# Patient Record
Sex: Female | Born: 1955 | Race: White | Hispanic: No | Marital: Single | Smoking: Never smoker
Health system: Southern US, Community
[De-identification: ages and names within clinical notes are randomized; demographics above are authoritative.]

---

## 1991-02-12 HISTORY — PX: ORIF ANKLE FRACTURE: SHX5408

## 1994-02-11 HISTORY — PX: FINGER SURGERY: SHX640

## 1999-04-02 ENCOUNTER — Ambulatory Visit (HOSPITAL_COMMUNITY): Admission: RE | Admit: 1999-04-02 | Discharge: 1999-04-02 | Payer: Self-pay | Admitting: *Deleted

## 2000-04-07 ENCOUNTER — Ambulatory Visit (HOSPITAL_COMMUNITY): Admission: RE | Admit: 2000-04-07 | Discharge: 2000-04-07 | Payer: Self-pay | Admitting: Family Medicine

## 2000-04-07 ENCOUNTER — Encounter: Payer: Self-pay | Admitting: Family Medicine

## 2000-04-14 ENCOUNTER — Encounter: Payer: Self-pay | Admitting: Family Medicine

## 2000-04-14 ENCOUNTER — Encounter: Admission: RE | Admit: 2000-04-14 | Discharge: 2000-04-14 | Payer: Self-pay | Admitting: Family Medicine

## 2004-02-22 ENCOUNTER — Ambulatory Visit: Payer: Self-pay | Admitting: Internal Medicine

## 2004-03-06 ENCOUNTER — Ambulatory Visit: Payer: Self-pay | Admitting: Internal Medicine

## 2004-03-06 HISTORY — PX: COLONOSCOPY: SHX174

## 2014-03-03 ENCOUNTER — Encounter: Payer: Self-pay | Admitting: Internal Medicine

## 2017-08-11 HISTORY — PX: OTHER SURGICAL HISTORY: SHX169

## 2017-08-12 DIAGNOSIS — Z01818 Encounter for other preprocedural examination: Secondary | ICD-10-CM

## 2017-11-06 ENCOUNTER — Other Ambulatory Visit: Payer: Self-pay | Admitting: Family Medicine

## 2017-11-06 DIAGNOSIS — R928 Other abnormal and inconclusive findings on diagnostic imaging of breast: Secondary | ICD-10-CM

## 2017-11-13 ENCOUNTER — Ambulatory Visit
Admission: RE | Admit: 2017-11-13 | Discharge: 2017-11-13 | Disposition: A | Discharge status: Home / Self Care | Payer: BLUE CROSS/BLUE SHIELD | Source: Ambulatory Visit | Attending: Family Medicine | Admitting: Family Medicine

## 2017-11-13 ENCOUNTER — Other Ambulatory Visit: Payer: Self-pay | Admitting: Family Medicine

## 2017-11-13 DIAGNOSIS — R928 Other abnormal and inconclusive findings on diagnostic imaging of breast: Secondary | ICD-10-CM

## 2017-11-13 DIAGNOSIS — R921 Mammographic calcification found on diagnostic imaging of breast: Secondary | ICD-10-CM

## 2017-11-14 ENCOUNTER — Other Ambulatory Visit: Payer: Self-pay | Admitting: Diagnostic Radiology

## 2017-11-14 DIAGNOSIS — R921 Mammographic calcification found on diagnostic imaging of breast: Secondary | ICD-10-CM

## 2017-12-19 ENCOUNTER — Other Ambulatory Visit: Payer: Self-pay | Admitting: General Surgery

## 2017-12-19 DIAGNOSIS — R928 Other abnormal and inconclusive findings on diagnostic imaging of breast: Secondary | ICD-10-CM

## 2018-03-23 DIAGNOSIS — R7303 Prediabetes: Secondary | ICD-10-CM | POA: Diagnosis not present

## 2018-03-23 DIAGNOSIS — E785 Hyperlipidemia, unspecified: Secondary | ICD-10-CM | POA: Diagnosis not present

## 2018-03-23 DIAGNOSIS — Z6835 Body mass index (BMI) 35.0-35.9, adult: Secondary | ICD-10-CM | POA: Diagnosis not present

## 2018-04-10 ENCOUNTER — Encounter: Payer: Self-pay | Admitting: Gastroenterology

## 2018-04-13 ENCOUNTER — Ambulatory Visit (AMBULATORY_SURGERY_CENTER): Payer: Self-pay | Admitting: *Deleted

## 2018-04-13 ENCOUNTER — Encounter: Payer: Self-pay | Admitting: Gastroenterology

## 2018-04-13 VITALS — Ht 66.0 in | Wt 215.0 lb

## 2018-04-13 DIAGNOSIS — Z1211 Encounter for screening for malignant neoplasm of colon: Secondary | ICD-10-CM

## 2018-04-13 MED ORDER — PEG-KCL-NACL-NASULF-NA ASC-C 140 G PO SOLR: 1.0000 | Freq: Once | ORAL | 0 refills | Status: AC

## 2018-04-13 NOTE — Progress Notes (Signed)
Patient denies any allergies to eggs or soy. Patient denies any problems with anesthesia/sedation. Patient denies any oxygen use at home. Patient denies taking any diet/weight loss medications or blood thinners. EMMI education offered, pt declined. Plenvu sample given today.

## 2018-04-14 ENCOUNTER — Encounter: Payer: Self-pay | Admitting: Gastroenterology

## 2018-04-20 ENCOUNTER — Other Ambulatory Visit: Payer: Self-pay

## 2018-04-20 ENCOUNTER — Ambulatory Visit (AMBULATORY_SURGERY_CENTER): Payer: PPO | Admitting: Gastroenterology

## 2018-04-20 ENCOUNTER — Encounter: Payer: Self-pay | Admitting: Gastroenterology

## 2018-04-20 VITALS — BP 137/76 | HR 66 | Temp 97.3°F | Resp 13 | Ht 66.0 in | Wt 215.0 lb

## 2018-04-20 DIAGNOSIS — D128 Benign neoplasm of rectum: Secondary | ICD-10-CM

## 2018-04-20 DIAGNOSIS — D123 Benign neoplasm of transverse colon: Secondary | ICD-10-CM

## 2018-04-20 DIAGNOSIS — K621 Rectal polyp: Secondary | ICD-10-CM | POA: Diagnosis not present

## 2018-04-20 DIAGNOSIS — Z1211 Encounter for screening for malignant neoplasm of colon: Secondary | ICD-10-CM

## 2018-04-20 DIAGNOSIS — D129 Benign neoplasm of anus and anal canal: Secondary | ICD-10-CM

## 2018-04-20 MED ORDER — SODIUM CHLORIDE 0.9 % IV SOLN: 500.0000 mL | INTRAVENOUS | Status: DC

## 2018-04-20 NOTE — Progress Notes (Signed)
Pt's states no medical or surgical changes since previsit or office visit. 

## 2018-04-20 NOTE — Patient Instructions (Signed)
Handouts Provided:  Polyps and Diverticulosis  YOU HAD AN ENDOSCOPIC PROCEDURE TODAY AT [facility]:   Refer to the procedure report that was given to you for any specific questions about what was found during the examination.  If the procedure report does not answer your questions, please call your gastroenterologist to clarify.  If you requested that your care partner not be given the details of your procedure findings, then the procedure report has been included in a sealed envelope for you to review at your convenience later.  YOU SHOULD EXPECT: Some feelings of bloating in the abdomen. Passage of more gas than usual.  Walking can help get rid of the air that was put into your GI tract during the procedure and reduce the bloating. If you had a lower endoscopy (such as a colonoscopy or flexible sigmoidoscopy) you may notice spotting of blood in your stool or on the toilet paper. If you underwent a bowel prep for your procedure, you may not have a normal bowel movement for a few days.  Please Note:  You might notice some irritation and congestion in your nose or some drainage.  This is from the oxygen used during your procedure.  There is no need for concern and it should clear up in a day or so.  SYMPTOMS TO REPORT IMMEDIATELY:   Following lower endoscopy (colonoscopy or flexible sigmoidoscopy):  Excessive amounts of blood in the stool  Significant tenderness or worsening of abdominal pains  Swelling of the abdomen that is new, acute  Fever of 100F or higher  For urgent or emergent issues, a gastroenterologist can be reached at any hour by calling [phone].   DIET:  We do recommend a small meal at first, but then you may proceed to your regular diet.  Drink plenty of fluids but you should avoid alcoholic beverages for 24 hours.  ACTIVITY:  You should plan to take it easy for the rest of today and you should NOT DRIVE or use heavy machinery until tomorrow (because of  the sedation medicines used during the test).    FOLLOW UP: Our staff will call the number listed on your records the next business day following your procedure to check on you and address any questions or concerns that you may have regarding the information given to you following your procedure. If we do not reach you, we will leave a message.  However, if you are feeling well and you are not experiencing any problems, there is no need to return our call.  We will assume that you have returned to your regular daily activities without incident.  If any biopsies were taken you will be contacted by phone or by letter within the next 1-3 weeks.  Please call us at [phone] if you have not heard about the biopsies in 3 weeks.    SIGNATURES/CONFIDENTIALITY: You and/or your care partner have signed paperwork which will be entered into your electronic medical record.  These signatures attest to the fact that that the information above on your After Visit Summary has been reviewed and is understood.  Full responsibility of the confidentiality of this discharge information lies with you and/or your care-partner.

## 2018-04-20 NOTE — Progress Notes (Signed)
Called to room to assist during endoscopic procedure.  Patient ID and intended procedure confirmed with present staff. Received instructions for my participation in the procedure from the performing physician.  

## 2018-04-20 NOTE — Progress Notes (Signed)
A and O x3. Report to RN. Tolerated MAC anesthesia well.

## 2018-04-20 NOTE — Op Note (Signed)
[facility] Patient Name: Kayla Stevens Procedure Date: 04/20/2018 9:19 AM MRN: [id number] Endoscopist: Mauri Pole , MD Age: 63 Referring MD:  Date of Birth: 1955/10/13 Gender: Female Account #: [id number] Procedure:                Colonoscopy Indications:              Screening for colorectal malignant neoplasm Medicines:                Monitored Anesthesia Care Procedure:                Pre-Anesthesia Assessment:                           - Prior to the procedure, a History and Physical                            was performed, and patient medications and                            allergies were reviewed. The patient's tolerance of                            previous anesthesia was also reviewed. The risks                            and benefits of the procedure and the sedation                            options and risks were discussed with the patient.                            All questions were answered, and informed consent                            was obtained. Prior Anticoagulants: The patient has                            taken no previous anticoagulant or antiplatelet                            agents. ASA Grade Assessment: II - A patient with                            mild systemic disease. After reviewing the risks                            and benefits, the patient was deemed in                            satisfactory condition to undergo the procedure.                           After obtaining informed consent, the colonoscope  was passed under direct vision. Throughout the                            procedure, the patient's blood pressure, pulse, and                            oxygen saturations were monitored continuously. The                            Model PCF-[id number]) scope was introduced                            through the anus and advanced to the the cecum,                            identified  by appendiceal orifice and ileocecal                            valve. The colonoscopy was performed without                            difficulty. The patient tolerated the procedure                            well. The quality of the bowel preparation was                            good. The ileocecal valve, appendiceal orifice, and                            rectum were photographed. Scope In: 9:27:51 AM Scope Out: 9:47:04 AM Scope Withdrawal Time: 0 hours 10 minutes 41 seconds  Total Procedure Duration: 0 hours 19 minutes 13 seconds  Findings:                 The perianal and digital rectal examinations were                            normal.                           Two sessile polyps were found in the rectum and                            transverse colon. The polyps were 1 to 2 mm in                            size. These polyps were removed with a cold biopsy                            forceps. Resection and retrieval were complete.                           Multiple small and large-mouthed diverticula were  found in the sigmoid colon, descending colon,                            transverse colon, ascending colon and cecum. There                            was narrowing of the colon in association with the                            diverticular opening. Peri-diverticular erythema                            was seen. There was evidence of an impacted                            diverticulum.                           Non-bleeding internal hemorrhoids were found during                            retroflexion. The hemorrhoids were small. Complications:            No immediate complications. Estimated Blood Loss:     Estimated blood loss was minimal. Impression:               - Two 1 to 2 mm polyps in the rectum and in the                            transverse colon, removed with a cold biopsy                            forceps. Resected and retrieved.                            - Severe diverticulosis in the sigmoid colon, in                            the descending colon, in the transverse colon, in                            the ascending colon and in the cecum. There was                            narrowing of the colon in association with the                            diverticular opening. Peri-diverticular erythema                            was seen. There was evidence of an impacted                            diverticulum.                           -  Non-bleeding internal hemorrhoids. Recommendation:           - Patient has a contact number available for                            emergencies. The signs and symptoms of potential                            delayed complications were discussed with the                            patient. Return to normal activities tomorrow.                            Written discharge instructions were provided to the                            patient.                           - Resume previous diet.                           - Continue present medications.                           - Await pathology results.                           - Repeat colonoscopy in 7-10 years for surveillance                            based on pathology results. Mauri Pole, MD 04/20/2018 9:51:05 AM This report has been signed electronically.

## 2018-04-20 NOTE — Progress Notes (Signed)
Patient complained of gas pains while sitting in wheelchair, took her back to the restroom and gave her two levsin.  Patient found relief as she continued to pass gas.  Patient stated she's ready for discharge.

## 2018-04-21 ENCOUNTER — Telehealth: Payer: Self-pay | Admitting: *Deleted

## 2018-04-21 NOTE — Telephone Encounter (Signed)
First attempt, left VM.  

## 2018-04-21 NOTE — Telephone Encounter (Signed)
  Follow up Call-  Call back number 04/20/2018  Post procedure Call Back phone  # [phone] cell  Permission to leave phone message Yes  Some recent data might be hidden     Patient questions:  Do you have a fever, pain , or abdominal swelling? No. Pain Score  0 *  Have you tolerated food without any problems? Yes.    Have you been able to return to your normal activities? Yes.    Do you have any questions about your discharge instructions: Diet   No. Medications  No. Follow up visit  No.  Do you have questions or concerns about your Care? No.  Actions: * If pain score is 4 or above: No action needed, pain <4.

## 2018-04-27 ENCOUNTER — Encounter: Payer: Self-pay | Admitting: Gastroenterology

## 2018-06-24 DIAGNOSIS — Z0000 Encounter for general adult medical examination without abnormal findings: Secondary | ICD-10-CM | POA: Diagnosis not present

## 2018-06-24 DIAGNOSIS — Z1331 Encounter for screening for depression: Secondary | ICD-10-CM | POA: Diagnosis not present

## 2018-06-24 DIAGNOSIS — Z139 Encounter for screening, unspecified: Secondary | ICD-10-CM | POA: Diagnosis not present

## 2018-07-13 DIAGNOSIS — E785 Hyperlipidemia, unspecified: Secondary | ICD-10-CM | POA: Diagnosis not present

## 2018-07-13 DIAGNOSIS — R7303 Prediabetes: Secondary | ICD-10-CM | POA: Diagnosis not present

## 2018-07-20 DIAGNOSIS — R7303 Prediabetes: Secondary | ICD-10-CM | POA: Diagnosis not present

## 2018-07-20 DIAGNOSIS — E785 Hyperlipidemia, unspecified: Secondary | ICD-10-CM | POA: Diagnosis not present

## 2018-07-20 DIAGNOSIS — Z6834 Body mass index (BMI) 34.0-34.9, adult: Secondary | ICD-10-CM | POA: Diagnosis not present

## 2018-11-12 DIAGNOSIS — R7303 Prediabetes: Secondary | ICD-10-CM | POA: Diagnosis not present

## 2018-11-12 DIAGNOSIS — Z23 Encounter for immunization: Secondary | ICD-10-CM | POA: Diagnosis not present

## 2018-11-12 DIAGNOSIS — E785 Hyperlipidemia, unspecified: Secondary | ICD-10-CM | POA: Diagnosis not present

## 2018-11-19 DIAGNOSIS — R928 Other abnormal and inconclusive findings on diagnostic imaging of breast: Secondary | ICD-10-CM | POA: Diagnosis not present

## 2018-11-19 DIAGNOSIS — Z6834 Body mass index (BMI) 34.0-34.9, adult: Secondary | ICD-10-CM | POA: Diagnosis not present

## 2018-11-19 DIAGNOSIS — E785 Hyperlipidemia, unspecified: Secondary | ICD-10-CM | POA: Diagnosis not present

## 2018-11-19 DIAGNOSIS — R7303 Prediabetes: Secondary | ICD-10-CM | POA: Diagnosis not present

## 2018-12-09 ENCOUNTER — Other Ambulatory Visit: Payer: Self-pay | Admitting: General Surgery

## 2018-12-09 DIAGNOSIS — Z1231 Encounter for screening mammogram for malignant neoplasm of breast: Secondary | ICD-10-CM

## 2018-12-23 DIAGNOSIS — R928 Other abnormal and inconclusive findings on diagnostic imaging of breast: Secondary | ICD-10-CM | POA: Diagnosis not present

## 2018-12-24 ENCOUNTER — Other Ambulatory Visit: Payer: Self-pay | Admitting: General Surgery

## 2018-12-24 DIAGNOSIS — R928 Other abnormal and inconclusive findings on diagnostic imaging of breast: Secondary | ICD-10-CM

## 2018-12-24 DIAGNOSIS — Z853 Personal history of malignant neoplasm of breast: Secondary | ICD-10-CM

## 2019-01-06 ENCOUNTER — Other Ambulatory Visit: Payer: Self-pay

## 2019-01-06 ENCOUNTER — Ambulatory Visit
Admission: RE | Admit: 2019-01-06 | Discharge: 2019-01-06 | Disposition: A | Discharge status: Home / Self Care | Payer: PPO | Source: Ambulatory Visit | Attending: General Surgery | Admitting: General Surgery

## 2019-01-06 DIAGNOSIS — R928 Other abnormal and inconclusive findings on diagnostic imaging of breast: Secondary | ICD-10-CM

## 2019-01-06 DIAGNOSIS — R921 Mammographic calcification found on diagnostic imaging of breast: Secondary | ICD-10-CM | POA: Diagnosis not present

## 2019-01-14 NOTE — Progress Notes (Signed)
This mammogram did not look changed.  Will need 6 month follow up left breast dx mammogram.

## 2019-03-02 DIAGNOSIS — Z20822 Contact with and (suspected) exposure to covid-19: Secondary | ICD-10-CM | POA: Diagnosis not present

## 2019-03-02 DIAGNOSIS — Z20828 Contact with and (suspected) exposure to other viral communicable diseases: Secondary | ICD-10-CM | POA: Diagnosis not present

## 2019-03-15 DIAGNOSIS — E785 Hyperlipidemia, unspecified: Secondary | ICD-10-CM | POA: Diagnosis not present

## 2019-03-15 DIAGNOSIS — R7303 Prediabetes: Secondary | ICD-10-CM | POA: Diagnosis not present

## 2019-03-22 DIAGNOSIS — R7303 Prediabetes: Secondary | ICD-10-CM | POA: Diagnosis not present

## 2019-03-22 DIAGNOSIS — E785 Hyperlipidemia, unspecified: Secondary | ICD-10-CM | POA: Diagnosis not present

## 2019-03-22 DIAGNOSIS — Z6835 Body mass index (BMI) 35.0-35.9, adult: Secondary | ICD-10-CM | POA: Diagnosis not present

## 2019-06-08 DIAGNOSIS — H524 Presbyopia: Secondary | ICD-10-CM | POA: Diagnosis not present

## 2019-08-12 DIAGNOSIS — R7303 Prediabetes: Secondary | ICD-10-CM | POA: Diagnosis not present

## 2019-08-12 DIAGNOSIS — E785 Hyperlipidemia, unspecified: Secondary | ICD-10-CM | POA: Diagnosis not present

## 2019-08-19 DIAGNOSIS — Z136 Encounter for screening for cardiovascular disorders: Secondary | ICD-10-CM | POA: Diagnosis not present

## 2019-08-19 DIAGNOSIS — R7303 Prediabetes: Secondary | ICD-10-CM | POA: Diagnosis not present

## 2019-08-19 DIAGNOSIS — Z6835 Body mass index (BMI) 35.0-35.9, adult: Secondary | ICD-10-CM | POA: Diagnosis not present

## 2019-08-19 DIAGNOSIS — Z0000 Encounter for general adult medical examination without abnormal findings: Secondary | ICD-10-CM | POA: Diagnosis not present

## 2019-08-19 DIAGNOSIS — E785 Hyperlipidemia, unspecified: Secondary | ICD-10-CM | POA: Diagnosis not present

## 2019-08-19 DIAGNOSIS — Z139 Encounter for screening, unspecified: Secondary | ICD-10-CM | POA: Diagnosis not present

## 2019-08-19 DIAGNOSIS — Z1331 Encounter for screening for depression: Secondary | ICD-10-CM | POA: Diagnosis not present

## 2019-08-19 DIAGNOSIS — Z1339 Encounter for screening examination for other mental health and behavioral disorders: Secondary | ICD-10-CM | POA: Diagnosis not present

## 2019-08-19 DIAGNOSIS — Z7189 Other specified counseling: Secondary | ICD-10-CM | POA: Diagnosis not present

## 2019-10-21 ENCOUNTER — Other Ambulatory Visit: Payer: Self-pay

## 2019-10-21 ENCOUNTER — Ambulatory Visit
Admission: RE | Admit: 2019-10-21 | Discharge: 2019-10-21 | Disposition: A | Discharge status: Home / Self Care | Payer: PPO | Source: Ambulatory Visit | Attending: General Surgery | Admitting: General Surgery

## 2019-10-21 ENCOUNTER — Other Ambulatory Visit: Payer: Self-pay | Admitting: General Surgery

## 2019-10-21 DIAGNOSIS — Z1231 Encounter for screening mammogram for malignant neoplasm of breast: Secondary | ICD-10-CM

## 2019-10-21 DIAGNOSIS — R921 Mammographic calcification found on diagnostic imaging of breast: Secondary | ICD-10-CM | POA: Diagnosis not present

## 2019-11-29 DIAGNOSIS — R928 Other abnormal and inconclusive findings on diagnostic imaging of breast: Secondary | ICD-10-CM | POA: Diagnosis not present

## 2019-11-30 DIAGNOSIS — Z20822 Contact with and (suspected) exposure to covid-19: Secondary | ICD-10-CM | POA: Diagnosis not present

## 2020-01-10 DIAGNOSIS — E785 Hyperlipidemia, unspecified: Secondary | ICD-10-CM | POA: Diagnosis not present

## 2020-01-10 DIAGNOSIS — R7303 Prediabetes: Secondary | ICD-10-CM | POA: Diagnosis not present

## 2020-01-17 DIAGNOSIS — E785 Hyperlipidemia, unspecified: Secondary | ICD-10-CM | POA: Diagnosis not present

## 2020-01-17 DIAGNOSIS — L259 Unspecified contact dermatitis, unspecified cause: Secondary | ICD-10-CM | POA: Diagnosis not present

## 2020-01-17 DIAGNOSIS — R7303 Prediabetes: Secondary | ICD-10-CM | POA: Diagnosis not present

## 2020-01-17 DIAGNOSIS — M7918 Myalgia, other site: Secondary | ICD-10-CM | POA: Diagnosis not present

## 2020-01-19 DIAGNOSIS — M545 Low back pain, unspecified: Secondary | ICD-10-CM | POA: Diagnosis not present

## 2020-01-19 DIAGNOSIS — M25552 Pain in left hip: Secondary | ICD-10-CM | POA: Diagnosis not present

## 2020-01-19 DIAGNOSIS — M25651 Stiffness of right hip, not elsewhere classified: Secondary | ICD-10-CM | POA: Diagnosis not present

## 2020-01-19 DIAGNOSIS — M25551 Pain in right hip: Secondary | ICD-10-CM | POA: Diagnosis not present

## 2020-01-27 DIAGNOSIS — M25651 Stiffness of right hip, not elsewhere classified: Secondary | ICD-10-CM | POA: Diagnosis not present

## 2020-01-27 DIAGNOSIS — M25551 Pain in right hip: Secondary | ICD-10-CM | POA: Diagnosis not present

## 2020-01-27 DIAGNOSIS — M545 Low back pain, unspecified: Secondary | ICD-10-CM | POA: Diagnosis not present

## 2020-01-27 DIAGNOSIS — M25552 Pain in left hip: Secondary | ICD-10-CM | POA: Diagnosis not present

## 2020-01-31 DIAGNOSIS — M25651 Stiffness of right hip, not elsewhere classified: Secondary | ICD-10-CM | POA: Diagnosis not present

## 2020-01-31 DIAGNOSIS — M25551 Pain in right hip: Secondary | ICD-10-CM | POA: Diagnosis not present

## 2020-01-31 DIAGNOSIS — M25552 Pain in left hip: Secondary | ICD-10-CM | POA: Diagnosis not present

## 2020-01-31 DIAGNOSIS — M545 Low back pain, unspecified: Secondary | ICD-10-CM | POA: Diagnosis not present

## 2020-02-02 DIAGNOSIS — M25552 Pain in left hip: Secondary | ICD-10-CM | POA: Diagnosis not present

## 2020-02-02 DIAGNOSIS — M25651 Stiffness of right hip, not elsewhere classified: Secondary | ICD-10-CM | POA: Diagnosis not present

## 2020-02-02 DIAGNOSIS — M25551 Pain in right hip: Secondary | ICD-10-CM | POA: Diagnosis not present

## 2020-02-02 DIAGNOSIS — M545 Low back pain, unspecified: Secondary | ICD-10-CM | POA: Diagnosis not present

## 2020-02-02 DIAGNOSIS — Z6835 Body mass index (BMI) 35.0-35.9, adult: Secondary | ICD-10-CM | POA: Diagnosis not present

## 2020-02-02 DIAGNOSIS — Z9109 Other allergy status, other than to drugs and biological substances: Secondary | ICD-10-CM | POA: Diagnosis not present

## 2020-02-07 DIAGNOSIS — M545 Low back pain, unspecified: Secondary | ICD-10-CM | POA: Diagnosis not present

## 2020-02-07 DIAGNOSIS — M25552 Pain in left hip: Secondary | ICD-10-CM | POA: Diagnosis not present

## 2020-02-07 DIAGNOSIS — M25551 Pain in right hip: Secondary | ICD-10-CM | POA: Diagnosis not present

## 2020-02-07 DIAGNOSIS — M25651 Stiffness of right hip, not elsewhere classified: Secondary | ICD-10-CM | POA: Diagnosis not present

## 2020-02-09 DIAGNOSIS — M25552 Pain in left hip: Secondary | ICD-10-CM | POA: Diagnosis not present

## 2020-02-09 DIAGNOSIS — M25551 Pain in right hip: Secondary | ICD-10-CM | POA: Diagnosis not present

## 2020-02-09 DIAGNOSIS — M545 Low back pain, unspecified: Secondary | ICD-10-CM | POA: Diagnosis not present

## 2020-02-09 DIAGNOSIS — M25651 Stiffness of right hip, not elsewhere classified: Secondary | ICD-10-CM | POA: Diagnosis not present

## 2020-02-16 DIAGNOSIS — M25552 Pain in left hip: Secondary | ICD-10-CM | POA: Diagnosis not present

## 2020-02-16 DIAGNOSIS — M25551 Pain in right hip: Secondary | ICD-10-CM | POA: Diagnosis not present

## 2020-02-16 DIAGNOSIS — M25651 Stiffness of right hip, not elsewhere classified: Secondary | ICD-10-CM | POA: Diagnosis not present

## 2020-02-16 DIAGNOSIS — M545 Low back pain, unspecified: Secondary | ICD-10-CM | POA: Diagnosis not present

## 2020-02-18 DIAGNOSIS — M25651 Stiffness of right hip, not elsewhere classified: Secondary | ICD-10-CM | POA: Diagnosis not present

## 2020-02-18 DIAGNOSIS — M25551 Pain in right hip: Secondary | ICD-10-CM | POA: Diagnosis not present

## 2020-02-18 DIAGNOSIS — M545 Low back pain, unspecified: Secondary | ICD-10-CM | POA: Diagnosis not present

## 2020-02-18 DIAGNOSIS — M25552 Pain in left hip: Secondary | ICD-10-CM | POA: Diagnosis not present

## 2020-02-25 DIAGNOSIS — M25651 Stiffness of right hip, not elsewhere classified: Secondary | ICD-10-CM | POA: Diagnosis not present

## 2020-02-25 DIAGNOSIS — M25551 Pain in right hip: Secondary | ICD-10-CM | POA: Diagnosis not present

## 2020-02-25 DIAGNOSIS — M545 Low back pain, unspecified: Secondary | ICD-10-CM | POA: Diagnosis not present

## 2020-02-25 DIAGNOSIS — M25552 Pain in left hip: Secondary | ICD-10-CM | POA: Diagnosis not present

## 2020-03-02 DIAGNOSIS — L309 Dermatitis, unspecified: Secondary | ICD-10-CM | POA: Diagnosis not present

## 2020-03-02 DIAGNOSIS — D485 Neoplasm of uncertain behavior of skin: Secondary | ICD-10-CM | POA: Diagnosis not present

## 2020-03-02 DIAGNOSIS — C4441 Basal cell carcinoma of skin of scalp and neck: Secondary | ICD-10-CM | POA: Diagnosis not present

## 2020-03-27 DIAGNOSIS — N950 Postmenopausal bleeding: Secondary | ICD-10-CM | POA: Diagnosis not present

## 2020-03-27 DIAGNOSIS — N840 Polyp of corpus uteri: Secondary | ICD-10-CM | POA: Diagnosis not present

## 2020-05-03 DIAGNOSIS — N840 Polyp of corpus uteri: Secondary | ICD-10-CM | POA: Diagnosis not present

## 2020-05-03 DIAGNOSIS — N950 Postmenopausal bleeding: Secondary | ICD-10-CM | POA: Diagnosis not present

## 2020-05-15 DIAGNOSIS — R7303 Prediabetes: Secondary | ICD-10-CM | POA: Diagnosis not present

## 2020-05-15 DIAGNOSIS — E785 Hyperlipidemia, unspecified: Secondary | ICD-10-CM | POA: Diagnosis not present

## 2020-05-22 DIAGNOSIS — Z139 Encounter for screening, unspecified: Secondary | ICD-10-CM | POA: Diagnosis not present

## 2020-05-22 DIAGNOSIS — Z136 Encounter for screening for cardiovascular disorders: Secondary | ICD-10-CM | POA: Diagnosis not present

## 2020-05-22 DIAGNOSIS — Z1331 Encounter for screening for depression: Secondary | ICD-10-CM | POA: Diagnosis not present

## 2020-05-22 DIAGNOSIS — R7303 Prediabetes: Secondary | ICD-10-CM | POA: Diagnosis not present

## 2020-05-22 DIAGNOSIS — Z1339 Encounter for screening examination for other mental health and behavioral disorders: Secondary | ICD-10-CM | POA: Diagnosis not present

## 2020-05-22 DIAGNOSIS — Z23 Encounter for immunization: Secondary | ICD-10-CM | POA: Diagnosis not present

## 2020-05-22 DIAGNOSIS — E669 Obesity, unspecified: Secondary | ICD-10-CM | POA: Diagnosis not present

## 2020-05-22 DIAGNOSIS — E785 Hyperlipidemia, unspecified: Secondary | ICD-10-CM | POA: Diagnosis not present

## 2020-05-22 DIAGNOSIS — Z6835 Body mass index (BMI) 35.0-35.9, adult: Secondary | ICD-10-CM | POA: Diagnosis not present

## 2020-05-22 DIAGNOSIS — Z0000 Encounter for general adult medical examination without abnormal findings: Secondary | ICD-10-CM | POA: Diagnosis not present

## 2020-06-06 DIAGNOSIS — N840 Polyp of corpus uteri: Secondary | ICD-10-CM | POA: Diagnosis not present

## 2020-06-06 DIAGNOSIS — N950 Postmenopausal bleeding: Secondary | ICD-10-CM | POA: Diagnosis not present

## 2020-06-15 DIAGNOSIS — E669 Obesity, unspecified: Secondary | ICD-10-CM | POA: Diagnosis not present

## 2020-06-15 DIAGNOSIS — N840 Polyp of corpus uteri: Secondary | ICD-10-CM | POA: Diagnosis not present

## 2020-06-15 DIAGNOSIS — N950 Postmenopausal bleeding: Secondary | ICD-10-CM | POA: Diagnosis not present

## 2020-09-25 DIAGNOSIS — R7303 Prediabetes: Secondary | ICD-10-CM | POA: Diagnosis not present

## 2020-09-25 DIAGNOSIS — E785 Hyperlipidemia, unspecified: Secondary | ICD-10-CM | POA: Diagnosis not present

## 2020-10-02 DIAGNOSIS — E785 Hyperlipidemia, unspecified: Secondary | ICD-10-CM | POA: Diagnosis not present

## 2020-10-02 DIAGNOSIS — Z6834 Body mass index (BMI) 34.0-34.9, adult: Secondary | ICD-10-CM | POA: Diagnosis not present

## 2020-10-02 DIAGNOSIS — Z23 Encounter for immunization: Secondary | ICD-10-CM | POA: Diagnosis not present

## 2020-10-02 DIAGNOSIS — R7303 Prediabetes: Secondary | ICD-10-CM | POA: Diagnosis not present

## 2020-10-12 IMAGING — MG DIGITAL DIAGNOSTIC BILAT W/ TOMO W/ CAD
6 of 10 series · 6 of 26 positions shown · non-contrast
Comparison: Previous exam(s).

CLINICAL DATA: 64-year-old who underwent stereotactic core needle
biopsies of both breasts in November 2017. Pathology of
calcifications in the UPPER OUTER QUADRANT of the RIGHT breast
returned as benign fibrocystic changes. Pathology of calcifications
involving the UPPER OUTER QUADRANT of the LEFT breast returned as
flat epithelial atypia, and excision was recommended. The patient
decided against excision, and returns now for interval follow-up.

EXAM:
DIGITAL DIAGNOSTIC BILATERAL MAMMOGRAM WITH TOMO AND CAD

[L ML]
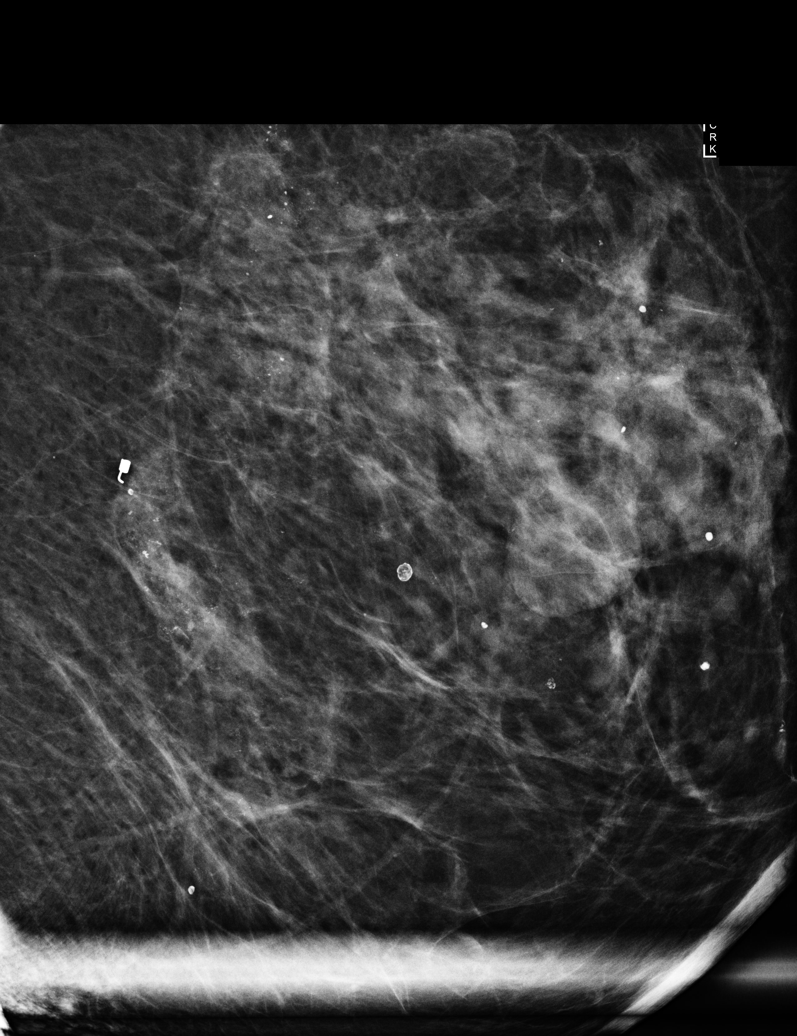

[L CC]
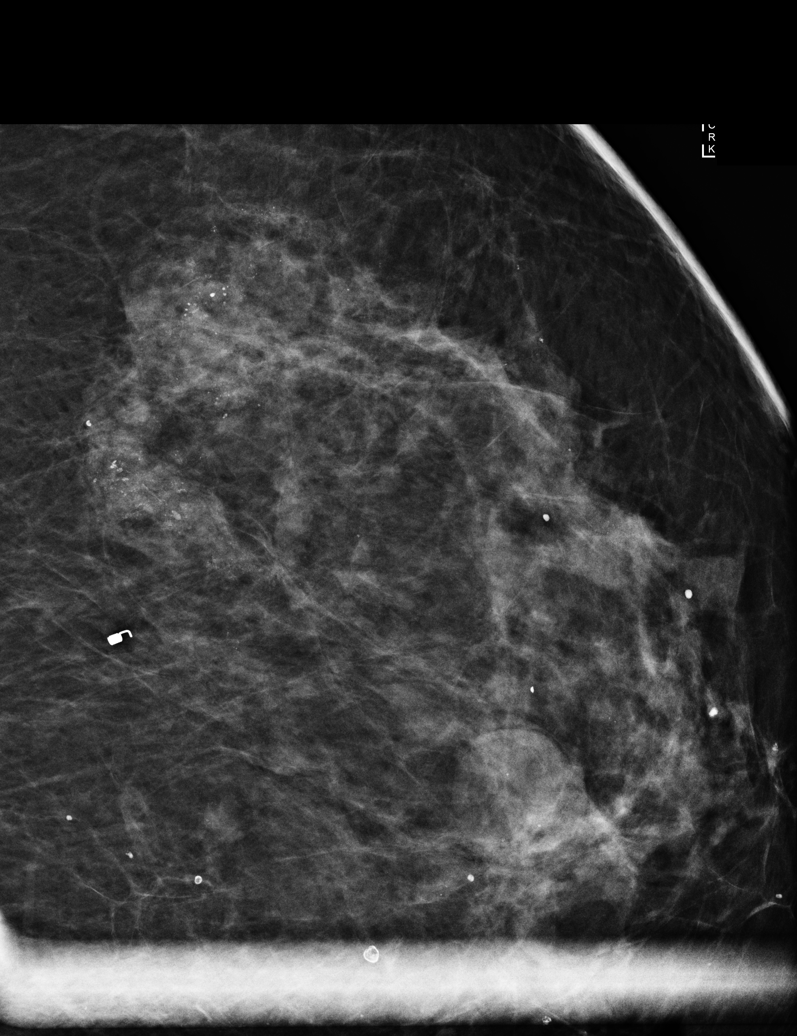

[R MLO synth-2D]
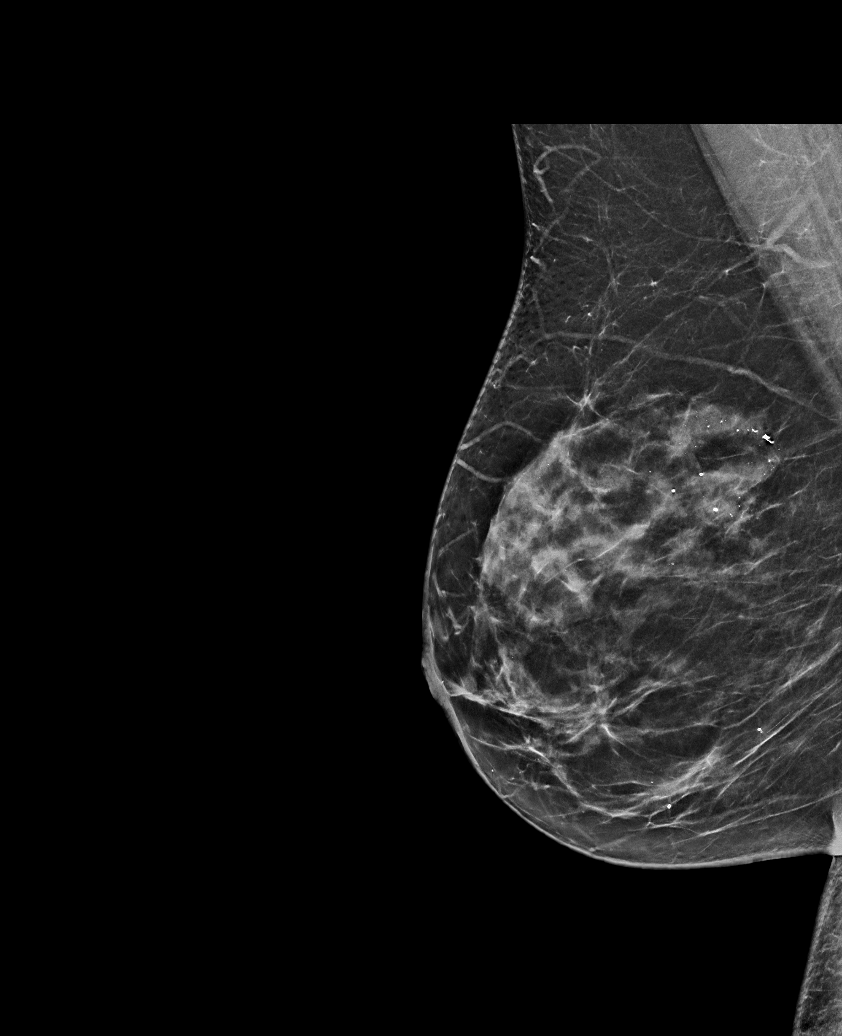

[R CC synth-2D]
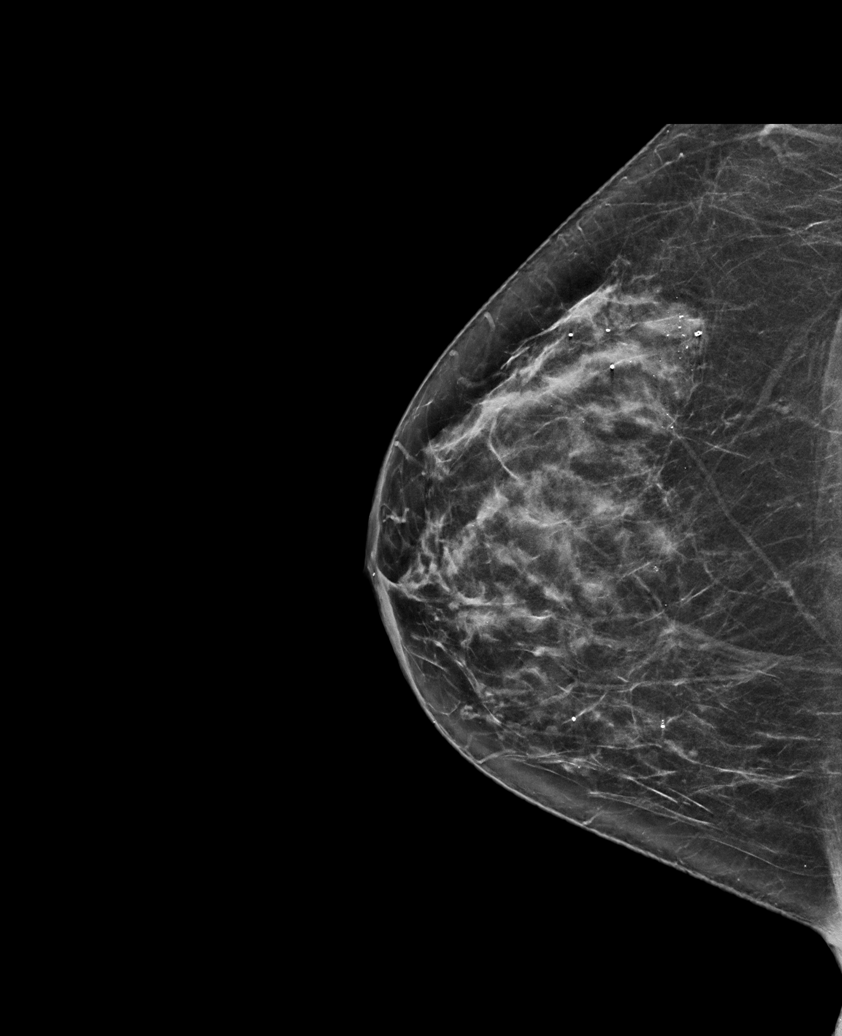

[L CC synth-2D]
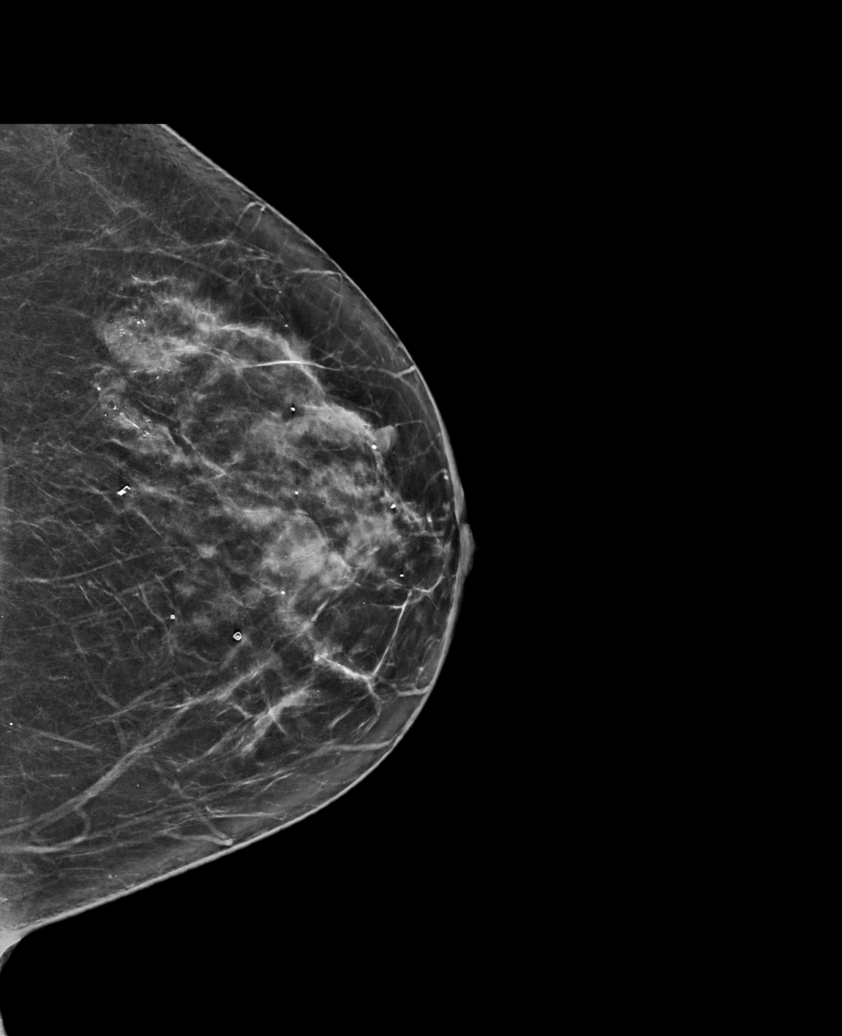

[L MLO synth-2D]
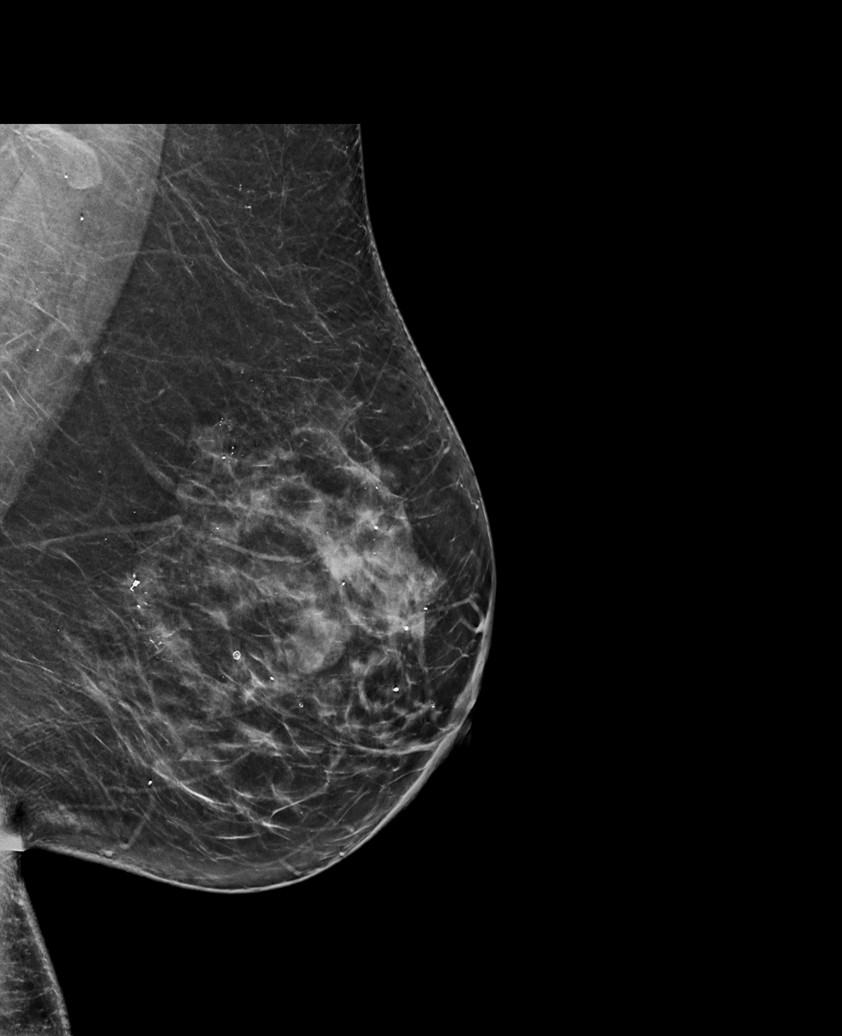

[6 of 26 positions shown; findings below may reference images not displayed]

ACR Breast Density Category c: The breast tissue is heterogeneously
dense, which may obscure small masses.
FINDINGS: Tomosynthesis and synthesized full field CC and MLO views of both
breasts were obtained. Standard spot magnification CC and
mediolateral views of the LEFT breast calcifications were also
obtained.

The fine pleomorphic calcifications involving the UPPER OUTER
QUADRANT the LEFT breast at POSTERIOR depth, spanning in total
approximately 10 x 3 x 6 cm, Buqee Grigoryan, have undergone a
slight morphologic change in that there are now areas of
calcifications that have a linear orientation.

The benign cyst in the retroareolar LEFT breast is unchanged. No new
findings elsewhere in the LEFT breast.

The calcifications in the UPPER OUTER QUADRANT of the RIGHT breast
at POSTERIOR depth, biopsy-proven fibrocystic changes, are
unchanged. No findings suspicious for malignancy in the RIGHT
breast.

Mammographic images were processed with CAD.
IMPRESSION: 1. Extensive fine pleomorphic calcifications involving the UPPER
OUTER QUADRANT of the LEFT breast, Buqee Grigoryan, with a
morphologic change in that there are now calcifications in a linear
orientation, more worrisome for DCIS.
2. No mammographic evidence of malignancy involving the RIGHT
breast.

RECOMMENDATION:
1. Surgical excision of the calcifications in the UPPER OUTER
QUADRANT of the LEFT breast is still recommended. The patient states
that she will discuss excision with Dr. Nevi.
2. If the patient decides against surgical excision, then a
six-month follow-up diagnostic LEFT mammogram is recommended.

I have discussed the findings and recommendations with the patient.
If applicable, a reminder letter will be sent to the patient
regarding the next appointment.

BI-RADS CATEGORY  4: Suspicious.

## 2020-11-06 ENCOUNTER — Other Ambulatory Visit: Payer: Self-pay | Admitting: General Surgery

## 2020-11-06 DIAGNOSIS — R5381 Other malaise: Secondary | ICD-10-CM

## 2021-01-24 DIAGNOSIS — R7303 Prediabetes: Secondary | ICD-10-CM | POA: Diagnosis not present

## 2021-01-24 DIAGNOSIS — E785 Hyperlipidemia, unspecified: Secondary | ICD-10-CM | POA: Diagnosis not present

## 2021-01-29 DIAGNOSIS — R7303 Prediabetes: Secondary | ICD-10-CM | POA: Diagnosis not present

## 2021-01-29 DIAGNOSIS — Z1331 Encounter for screening for depression: Secondary | ICD-10-CM | POA: Diagnosis not present

## 2021-01-29 DIAGNOSIS — Z6834 Body mass index (BMI) 34.0-34.9, adult: Secondary | ICD-10-CM | POA: Diagnosis not present

## 2021-01-29 DIAGNOSIS — E785 Hyperlipidemia, unspecified: Secondary | ICD-10-CM | POA: Diagnosis not present

## 2021-05-28 DIAGNOSIS — E785 Hyperlipidemia, unspecified: Secondary | ICD-10-CM | POA: Diagnosis not present

## 2021-05-28 DIAGNOSIS — R7303 Prediabetes: Secondary | ICD-10-CM | POA: Diagnosis not present

## 2021-06-04 DIAGNOSIS — R7303 Prediabetes: Secondary | ICD-10-CM | POA: Diagnosis not present

## 2021-06-04 DIAGNOSIS — E785 Hyperlipidemia, unspecified: Secondary | ICD-10-CM | POA: Diagnosis not present

## 2021-06-04 DIAGNOSIS — Z5320 Procedure and treatment not carried out because of patient's decision for unspecified reasons: Secondary | ICD-10-CM | POA: Diagnosis not present

## 2021-06-04 DIAGNOSIS — M7541 Impingement syndrome of right shoulder: Secondary | ICD-10-CM | POA: Diagnosis not present

## 2021-06-12 DIAGNOSIS — M25511 Pain in right shoulder: Secondary | ICD-10-CM | POA: Diagnosis not present

## 2021-06-18 DIAGNOSIS — M25611 Stiffness of right shoulder, not elsewhere classified: Secondary | ICD-10-CM | POA: Diagnosis not present

## 2021-06-18 DIAGNOSIS — M542 Cervicalgia: Secondary | ICD-10-CM | POA: Diagnosis not present

## 2021-06-18 DIAGNOSIS — M25511 Pain in right shoulder: Secondary | ICD-10-CM | POA: Diagnosis not present

## 2021-06-18 DIAGNOSIS — M7541 Impingement syndrome of right shoulder: Secondary | ICD-10-CM | POA: Diagnosis not present

## 2021-06-20 DIAGNOSIS — M7541 Impingement syndrome of right shoulder: Secondary | ICD-10-CM | POA: Diagnosis not present

## 2021-06-20 DIAGNOSIS — M542 Cervicalgia: Secondary | ICD-10-CM | POA: Diagnosis not present

## 2021-06-20 DIAGNOSIS — M25611 Stiffness of right shoulder, not elsewhere classified: Secondary | ICD-10-CM | POA: Diagnosis not present

## 2021-06-20 DIAGNOSIS — M25511 Pain in right shoulder: Secondary | ICD-10-CM | POA: Diagnosis not present

## 2021-06-25 DIAGNOSIS — M25611 Stiffness of right shoulder, not elsewhere classified: Secondary | ICD-10-CM | POA: Diagnosis not present

## 2021-06-25 DIAGNOSIS — M25511 Pain in right shoulder: Secondary | ICD-10-CM | POA: Diagnosis not present

## 2021-06-25 DIAGNOSIS — M542 Cervicalgia: Secondary | ICD-10-CM | POA: Diagnosis not present

## 2021-06-25 DIAGNOSIS — M7541 Impingement syndrome of right shoulder: Secondary | ICD-10-CM | POA: Diagnosis not present

## 2021-06-27 DIAGNOSIS — M25611 Stiffness of right shoulder, not elsewhere classified: Secondary | ICD-10-CM | POA: Diagnosis not present

## 2021-06-27 DIAGNOSIS — M25511 Pain in right shoulder: Secondary | ICD-10-CM | POA: Diagnosis not present

## 2021-06-27 DIAGNOSIS — M542 Cervicalgia: Secondary | ICD-10-CM | POA: Diagnosis not present

## 2021-06-27 DIAGNOSIS — M7541 Impingement syndrome of right shoulder: Secondary | ICD-10-CM | POA: Diagnosis not present

## 2021-07-02 DIAGNOSIS — M542 Cervicalgia: Secondary | ICD-10-CM | POA: Diagnosis not present

## 2021-07-02 DIAGNOSIS — M25611 Stiffness of right shoulder, not elsewhere classified: Secondary | ICD-10-CM | POA: Diagnosis not present

## 2021-07-02 DIAGNOSIS — M7541 Impingement syndrome of right shoulder: Secondary | ICD-10-CM | POA: Diagnosis not present

## 2021-07-02 DIAGNOSIS — M25511 Pain in right shoulder: Secondary | ICD-10-CM | POA: Diagnosis not present

## 2021-07-04 DIAGNOSIS — M25511 Pain in right shoulder: Secondary | ICD-10-CM | POA: Diagnosis not present

## 2021-07-04 DIAGNOSIS — M542 Cervicalgia: Secondary | ICD-10-CM | POA: Diagnosis not present

## 2021-07-04 DIAGNOSIS — M7541 Impingement syndrome of right shoulder: Secondary | ICD-10-CM | POA: Diagnosis not present

## 2021-07-04 DIAGNOSIS — M25611 Stiffness of right shoulder, not elsewhere classified: Secondary | ICD-10-CM | POA: Diagnosis not present

## 2021-07-10 DIAGNOSIS — M7541 Impingement syndrome of right shoulder: Secondary | ICD-10-CM | POA: Diagnosis not present

## 2021-07-10 DIAGNOSIS — M542 Cervicalgia: Secondary | ICD-10-CM | POA: Diagnosis not present

## 2021-07-10 DIAGNOSIS — M25511 Pain in right shoulder: Secondary | ICD-10-CM | POA: Diagnosis not present

## 2021-07-10 DIAGNOSIS — M25611 Stiffness of right shoulder, not elsewhere classified: Secondary | ICD-10-CM | POA: Diagnosis not present

## 2021-07-13 DIAGNOSIS — M542 Cervicalgia: Secondary | ICD-10-CM | POA: Diagnosis not present

## 2021-07-13 DIAGNOSIS — M25611 Stiffness of right shoulder, not elsewhere classified: Secondary | ICD-10-CM | POA: Diagnosis not present

## 2021-07-13 DIAGNOSIS — M7541 Impingement syndrome of right shoulder: Secondary | ICD-10-CM | POA: Diagnosis not present

## 2021-07-13 DIAGNOSIS — M25511 Pain in right shoulder: Secondary | ICD-10-CM | POA: Diagnosis not present

## 2021-07-16 DIAGNOSIS — M542 Cervicalgia: Secondary | ICD-10-CM | POA: Diagnosis not present

## 2021-07-16 DIAGNOSIS — M7541 Impingement syndrome of right shoulder: Secondary | ICD-10-CM | POA: Diagnosis not present

## 2021-07-16 DIAGNOSIS — M25511 Pain in right shoulder: Secondary | ICD-10-CM | POA: Diagnosis not present

## 2021-07-16 DIAGNOSIS — M25611 Stiffness of right shoulder, not elsewhere classified: Secondary | ICD-10-CM | POA: Diagnosis not present

## 2021-07-18 DIAGNOSIS — M7541 Impingement syndrome of right shoulder: Secondary | ICD-10-CM | POA: Diagnosis not present

## 2021-07-18 DIAGNOSIS — M542 Cervicalgia: Secondary | ICD-10-CM | POA: Diagnosis not present

## 2021-07-18 DIAGNOSIS — M25511 Pain in right shoulder: Secondary | ICD-10-CM | POA: Diagnosis not present

## 2021-07-18 DIAGNOSIS — M25611 Stiffness of right shoulder, not elsewhere classified: Secondary | ICD-10-CM | POA: Diagnosis not present

## 2021-07-19 DIAGNOSIS — Z0189 Encounter for other specified special examinations: Secondary | ICD-10-CM | POA: Diagnosis not present

## 2021-07-19 DIAGNOSIS — Z1339 Encounter for screening examination for other mental health and behavioral disorders: Secondary | ICD-10-CM | POA: Diagnosis not present

## 2021-07-19 DIAGNOSIS — Z136 Encounter for screening for cardiovascular disorders: Secondary | ICD-10-CM | POA: Diagnosis not present

## 2021-07-19 DIAGNOSIS — Z0000 Encounter for general adult medical examination without abnormal findings: Secondary | ICD-10-CM | POA: Diagnosis not present

## 2021-07-19 DIAGNOSIS — Z139 Encounter for screening, unspecified: Secondary | ICD-10-CM | POA: Diagnosis not present

## 2021-07-19 DIAGNOSIS — Z1331 Encounter for screening for depression: Secondary | ICD-10-CM | POA: Diagnosis not present

## 2021-07-19 DIAGNOSIS — Z1389 Encounter for screening for other disorder: Secondary | ICD-10-CM | POA: Diagnosis not present

## 2021-07-23 DIAGNOSIS — M7541 Impingement syndrome of right shoulder: Secondary | ICD-10-CM | POA: Diagnosis not present

## 2021-07-23 DIAGNOSIS — M25511 Pain in right shoulder: Secondary | ICD-10-CM | POA: Diagnosis not present

## 2021-07-23 DIAGNOSIS — M542 Cervicalgia: Secondary | ICD-10-CM | POA: Diagnosis not present

## 2021-07-23 DIAGNOSIS — M25611 Stiffness of right shoulder, not elsewhere classified: Secondary | ICD-10-CM | POA: Diagnosis not present

## 2021-07-25 DIAGNOSIS — M7541 Impingement syndrome of right shoulder: Secondary | ICD-10-CM | POA: Diagnosis not present

## 2021-07-25 DIAGNOSIS — M25511 Pain in right shoulder: Secondary | ICD-10-CM | POA: Diagnosis not present

## 2021-07-25 DIAGNOSIS — M542 Cervicalgia: Secondary | ICD-10-CM | POA: Diagnosis not present

## 2021-07-25 DIAGNOSIS — M25611 Stiffness of right shoulder, not elsewhere classified: Secondary | ICD-10-CM | POA: Diagnosis not present

## 2021-07-30 DIAGNOSIS — M542 Cervicalgia: Secondary | ICD-10-CM | POA: Diagnosis not present

## 2021-07-30 DIAGNOSIS — M7541 Impingement syndrome of right shoulder: Secondary | ICD-10-CM | POA: Diagnosis not present

## 2021-07-30 DIAGNOSIS — M25611 Stiffness of right shoulder, not elsewhere classified: Secondary | ICD-10-CM | POA: Diagnosis not present

## 2021-07-30 DIAGNOSIS — M25511 Pain in right shoulder: Secondary | ICD-10-CM | POA: Diagnosis not present

## 2021-08-01 DIAGNOSIS — M25611 Stiffness of right shoulder, not elsewhere classified: Secondary | ICD-10-CM | POA: Diagnosis not present

## 2021-08-01 DIAGNOSIS — M7541 Impingement syndrome of right shoulder: Secondary | ICD-10-CM | POA: Diagnosis not present

## 2021-08-01 DIAGNOSIS — M25511 Pain in right shoulder: Secondary | ICD-10-CM | POA: Diagnosis not present

## 2021-08-01 DIAGNOSIS — M542 Cervicalgia: Secondary | ICD-10-CM | POA: Diagnosis not present

## 2021-08-06 DIAGNOSIS — M25611 Stiffness of right shoulder, not elsewhere classified: Secondary | ICD-10-CM | POA: Diagnosis not present

## 2021-08-06 DIAGNOSIS — M7541 Impingement syndrome of right shoulder: Secondary | ICD-10-CM | POA: Diagnosis not present

## 2021-08-06 DIAGNOSIS — M25511 Pain in right shoulder: Secondary | ICD-10-CM | POA: Diagnosis not present

## 2021-08-06 DIAGNOSIS — M542 Cervicalgia: Secondary | ICD-10-CM | POA: Diagnosis not present

## 2021-08-08 DIAGNOSIS — M7541 Impingement syndrome of right shoulder: Secondary | ICD-10-CM | POA: Diagnosis not present

## 2021-08-08 DIAGNOSIS — M25511 Pain in right shoulder: Secondary | ICD-10-CM | POA: Diagnosis not present

## 2021-08-08 DIAGNOSIS — M25611 Stiffness of right shoulder, not elsewhere classified: Secondary | ICD-10-CM | POA: Diagnosis not present

## 2021-08-08 DIAGNOSIS — M542 Cervicalgia: Secondary | ICD-10-CM | POA: Diagnosis not present

## 2021-10-23 DIAGNOSIS — R7303 Prediabetes: Secondary | ICD-10-CM | POA: Diagnosis not present

## 2021-10-23 DIAGNOSIS — E785 Hyperlipidemia, unspecified: Secondary | ICD-10-CM | POA: Diagnosis not present

## 2021-11-06 DIAGNOSIS — Z23 Encounter for immunization: Secondary | ICD-10-CM | POA: Diagnosis not present

## 2021-11-06 DIAGNOSIS — R7303 Prediabetes: Secondary | ICD-10-CM | POA: Diagnosis not present

## 2021-11-06 DIAGNOSIS — M7541 Impingement syndrome of right shoulder: Secondary | ICD-10-CM | POA: Diagnosis not present

## 2021-11-06 DIAGNOSIS — Z6834 Body mass index (BMI) 34.0-34.9, adult: Secondary | ICD-10-CM | POA: Diagnosis not present

## 2021-11-06 DIAGNOSIS — E785 Hyperlipidemia, unspecified: Secondary | ICD-10-CM | POA: Diagnosis not present

## 2021-12-04 DIAGNOSIS — Z23 Encounter for immunization: Secondary | ICD-10-CM | POA: Diagnosis not present

## 2022-01-23 DIAGNOSIS — Z13820 Encounter for screening for osteoporosis: Secondary | ICD-10-CM | POA: Diagnosis not present

## 2022-01-23 DIAGNOSIS — E2839 Other primary ovarian failure: Secondary | ICD-10-CM | POA: Diagnosis not present

## 2022-02-26 DIAGNOSIS — R7303 Prediabetes: Secondary | ICD-10-CM | POA: Diagnosis not present

## 2022-02-26 DIAGNOSIS — E785 Hyperlipidemia, unspecified: Secondary | ICD-10-CM | POA: Diagnosis not present

## 2022-03-05 DIAGNOSIS — Z6834 Body mass index (BMI) 34.0-34.9, adult: Secondary | ICD-10-CM | POA: Diagnosis not present

## 2022-03-05 DIAGNOSIS — Z9181 History of falling: Secondary | ICD-10-CM | POA: Diagnosis not present

## 2022-03-05 DIAGNOSIS — E785 Hyperlipidemia, unspecified: Secondary | ICD-10-CM | POA: Diagnosis not present

## 2022-03-05 DIAGNOSIS — R7303 Prediabetes: Secondary | ICD-10-CM | POA: Diagnosis not present

## 2022-04-08 DIAGNOSIS — M19011 Primary osteoarthritis, right shoulder: Secondary | ICD-10-CM | POA: Diagnosis not present

## 2022-04-08 DIAGNOSIS — M542 Cervicalgia: Secondary | ICD-10-CM | POA: Diagnosis not present

## 2022-04-19 DIAGNOSIS — M19011 Primary osteoarthritis, right shoulder: Secondary | ICD-10-CM | POA: Diagnosis not present

## 2022-04-24 DIAGNOSIS — H524 Presbyopia: Secondary | ICD-10-CM | POA: Diagnosis not present

## 2022-05-03 DIAGNOSIS — M19011 Primary osteoarthritis, right shoulder: Secondary | ICD-10-CM | POA: Diagnosis not present

## 2022-06-28 DIAGNOSIS — E785 Hyperlipidemia, unspecified: Secondary | ICD-10-CM | POA: Diagnosis not present

## 2022-06-28 DIAGNOSIS — R7303 Prediabetes: Secondary | ICD-10-CM | POA: Diagnosis not present

## 2022-07-02 DIAGNOSIS — Z6834 Body mass index (BMI) 34.0-34.9, adult: Secondary | ICD-10-CM | POA: Diagnosis not present

## 2022-07-02 DIAGNOSIS — E785 Hyperlipidemia, unspecified: Secondary | ICD-10-CM | POA: Diagnosis not present

## 2022-07-02 DIAGNOSIS — Z5320 Procedure and treatment not carried out because of patient's decision for unspecified reasons: Secondary | ICD-10-CM | POA: Diagnosis not present

## 2022-07-02 DIAGNOSIS — R030 Elevated blood-pressure reading, without diagnosis of hypertension: Secondary | ICD-10-CM | POA: Diagnosis not present

## 2022-07-02 DIAGNOSIS — R7303 Prediabetes: Secondary | ICD-10-CM | POA: Diagnosis not present

## 2022-07-30 DIAGNOSIS — Z6834 Body mass index (BMI) 34.0-34.9, adult: Secondary | ICD-10-CM | POA: Diagnosis not present

## 2022-07-30 DIAGNOSIS — Z136 Encounter for screening for cardiovascular disorders: Secondary | ICD-10-CM | POA: Diagnosis not present

## 2022-07-30 DIAGNOSIS — Z139 Encounter for screening, unspecified: Secondary | ICD-10-CM | POA: Diagnosis not present

## 2022-07-30 DIAGNOSIS — Z1339 Encounter for screening examination for other mental health and behavioral disorders: Secondary | ICD-10-CM | POA: Diagnosis not present

## 2022-07-30 DIAGNOSIS — Z1331 Encounter for screening for depression: Secondary | ICD-10-CM | POA: Diagnosis not present

## 2022-07-30 DIAGNOSIS — Z0000 Encounter for general adult medical examination without abnormal findings: Secondary | ICD-10-CM | POA: Diagnosis not present

## 2022-07-30 DIAGNOSIS — E669 Obesity, unspecified: Secondary | ICD-10-CM | POA: Diagnosis not present

## 2022-09-03 DIAGNOSIS — M19011 Primary osteoarthritis, right shoulder: Secondary | ICD-10-CM | POA: Diagnosis not present

## 2022-10-28 DIAGNOSIS — E785 Hyperlipidemia, unspecified: Secondary | ICD-10-CM | POA: Diagnosis not present

## 2022-10-28 DIAGNOSIS — R7303 Prediabetes: Secondary | ICD-10-CM | POA: Diagnosis not present

## 2022-11-12 DIAGNOSIS — Z23 Encounter for immunization: Secondary | ICD-10-CM | POA: Diagnosis not present

## 2022-11-12 DIAGNOSIS — Z1331 Encounter for screening for depression: Secondary | ICD-10-CM | POA: Diagnosis not present

## 2022-11-12 DIAGNOSIS — E785 Hyperlipidemia, unspecified: Secondary | ICD-10-CM | POA: Diagnosis not present

## 2022-11-12 DIAGNOSIS — R7303 Prediabetes: Secondary | ICD-10-CM | POA: Diagnosis not present

## 2022-11-12 DIAGNOSIS — Z6834 Body mass index (BMI) 34.0-34.9, adult: Secondary | ICD-10-CM | POA: Diagnosis not present

## 2023-01-24 DIAGNOSIS — Z6834 Body mass index (BMI) 34.0-34.9, adult: Secondary | ICD-10-CM | POA: Diagnosis not present

## 2023-01-24 DIAGNOSIS — J069 Acute upper respiratory infection, unspecified: Secondary | ICD-10-CM | POA: Diagnosis not present

## 2023-01-24 DIAGNOSIS — J029 Acute pharyngitis, unspecified: Secondary | ICD-10-CM | POA: Diagnosis not present

## 2023-01-24 DIAGNOSIS — Z20822 Contact with and (suspected) exposure to covid-19: Secondary | ICD-10-CM | POA: Diagnosis not present

## 2023-01-29 DIAGNOSIS — Z6834 Body mass index (BMI) 34.0-34.9, adult: Secondary | ICD-10-CM | POA: Diagnosis not present

## 2023-01-29 DIAGNOSIS — R1012 Left upper quadrant pain: Secondary | ICD-10-CM | POA: Diagnosis not present

## 2023-01-29 DIAGNOSIS — R109 Unspecified abdominal pain: Secondary | ICD-10-CM | POA: Diagnosis not present

## 2023-01-29 DIAGNOSIS — R1084 Generalized abdominal pain: Secondary | ICD-10-CM | POA: Diagnosis not present

## 2023-02-14 DIAGNOSIS — R1012 Left upper quadrant pain: Secondary | ICD-10-CM | POA: Diagnosis not present

## 2023-02-14 DIAGNOSIS — Z6833 Body mass index (BMI) 33.0-33.9, adult: Secondary | ICD-10-CM | POA: Diagnosis not present

## 2023-02-19 DIAGNOSIS — K5730 Diverticulosis of large intestine without perforation or abscess without bleeding: Secondary | ICD-10-CM | POA: Diagnosis not present

## 2023-02-19 DIAGNOSIS — K6389 Other specified diseases of intestine: Secondary | ICD-10-CM | POA: Diagnosis not present

## 2023-02-19 DIAGNOSIS — N838 Other noninflammatory disorders of ovary, fallopian tube and broad ligament: Secondary | ICD-10-CM | POA: Diagnosis not present

## 2023-02-20 DIAGNOSIS — K529 Noninfective gastroenteritis and colitis, unspecified: Secondary | ICD-10-CM | POA: Diagnosis not present

## 2023-02-20 DIAGNOSIS — N838 Other noninflammatory disorders of ovary, fallopian tube and broad ligament: Secondary | ICD-10-CM | POA: Diagnosis not present

## 2023-02-20 DIAGNOSIS — Z6833 Body mass index (BMI) 33.0-33.9, adult: Secondary | ICD-10-CM | POA: Diagnosis not present

## 2023-02-20 DIAGNOSIS — R197 Diarrhea, unspecified: Secondary | ICD-10-CM | POA: Diagnosis not present

## 2023-03-17 DIAGNOSIS — R7303 Prediabetes: Secondary | ICD-10-CM | POA: Diagnosis not present

## 2023-03-17 DIAGNOSIS — E785 Hyperlipidemia, unspecified: Secondary | ICD-10-CM | POA: Diagnosis not present

## 2023-03-24 DIAGNOSIS — R7303 Prediabetes: Secondary | ICD-10-CM | POA: Diagnosis not present

## 2023-03-24 DIAGNOSIS — Z6832 Body mass index (BMI) 32.0-32.9, adult: Secondary | ICD-10-CM | POA: Diagnosis not present

## 2023-03-24 DIAGNOSIS — A0472 Enterocolitis due to Clostridium difficile, not specified as recurrent: Secondary | ICD-10-CM | POA: Diagnosis not present

## 2023-03-24 DIAGNOSIS — Z789 Other specified health status: Secondary | ICD-10-CM | POA: Diagnosis not present

## 2023-03-24 DIAGNOSIS — E785 Hyperlipidemia, unspecified: Secondary | ICD-10-CM | POA: Diagnosis not present

## 2023-03-28 DIAGNOSIS — N838 Other noninflammatory disorders of ovary, fallopian tube and broad ligament: Secondary | ICD-10-CM | POA: Diagnosis not present

## 2023-04-01 DIAGNOSIS — R1900 Intra-abdominal and pelvic swelling, mass and lump, unspecified site: Secondary | ICD-10-CM | POA: Diagnosis not present

## 2023-04-01 DIAGNOSIS — N9489 Other specified conditions associated with female genital organs and menstrual cycle: Secondary | ICD-10-CM | POA: Diagnosis not present

## 2023-04-07 DIAGNOSIS — M19011 Primary osteoarthritis, right shoulder: Secondary | ICD-10-CM | POA: Diagnosis not present

## 2023-04-08 DIAGNOSIS — R9389 Abnormal findings on diagnostic imaging of other specified body structures: Secondary | ICD-10-CM | POA: Diagnosis not present

## 2023-04-08 DIAGNOSIS — C541 Malignant neoplasm of endometrium: Secondary | ICD-10-CM | POA: Diagnosis not present

## 2023-04-08 DIAGNOSIS — Z170 Estrogen receptor positive status [ER+]: Secondary | ICD-10-CM | POA: Diagnosis not present

## 2023-04-08 DIAGNOSIS — N9489 Other specified conditions associated with female genital organs and menstrual cycle: Secondary | ICD-10-CM | POA: Diagnosis not present

## 2023-04-12 DIAGNOSIS — E669 Obesity, unspecified: Secondary | ICD-10-CM | POA: Diagnosis not present

## 2023-04-12 DIAGNOSIS — E785 Hyperlipidemia, unspecified: Secondary | ICD-10-CM | POA: Diagnosis not present

## 2023-04-15 DIAGNOSIS — C541 Malignant neoplasm of endometrium: Secondary | ICD-10-CM | POA: Diagnosis not present

## 2023-04-16 ENCOUNTER — Ambulatory Visit: Payer: PPO | Admitting: Gastroenterology

## 2023-04-29 DIAGNOSIS — C541 Malignant neoplasm of endometrium: Secondary | ICD-10-CM | POA: Diagnosis not present

## 2023-05-09 DIAGNOSIS — C786 Secondary malignant neoplasm of retroperitoneum and peritoneum: Secondary | ICD-10-CM | POA: Diagnosis not present

## 2023-05-09 DIAGNOSIS — Z5331 Laparoscopic surgical procedure converted to open procedure: Secondary | ICD-10-CM | POA: Diagnosis not present

## 2023-05-09 DIAGNOSIS — K5730 Diverticulosis of large intestine without perforation or abscess without bleeding: Secondary | ICD-10-CM | POA: Diagnosis not present

## 2023-05-09 DIAGNOSIS — C785 Secondary malignant neoplasm of large intestine and rectum: Secondary | ICD-10-CM | POA: Diagnosis not present

## 2023-05-09 DIAGNOSIS — N736 Female pelvic peritoneal adhesions (postinfective): Secondary | ICD-10-CM | POA: Diagnosis not present

## 2023-05-09 DIAGNOSIS — N83202 Unspecified ovarian cyst, left side: Secondary | ICD-10-CM | POA: Diagnosis not present

## 2023-05-09 DIAGNOSIS — D271 Benign neoplasm of left ovary: Secondary | ICD-10-CM | POA: Diagnosis not present

## 2023-05-09 DIAGNOSIS — D252 Subserosal leiomyoma of uterus: Secondary | ICD-10-CM | POA: Diagnosis not present

## 2023-05-09 DIAGNOSIS — C541 Malignant neoplasm of endometrium: Secondary | ICD-10-CM | POA: Diagnosis not present

## 2023-05-09 DIAGNOSIS — C7989 Secondary malignant neoplasm of other specified sites: Secondary | ICD-10-CM | POA: Diagnosis not present

## 2023-05-13 DIAGNOSIS — R7303 Prediabetes: Secondary | ICD-10-CM | POA: Diagnosis not present

## 2023-05-13 DIAGNOSIS — E785 Hyperlipidemia, unspecified: Secondary | ICD-10-CM | POA: Diagnosis not present

## 2023-05-27 DIAGNOSIS — C541 Malignant neoplasm of endometrium: Secondary | ICD-10-CM | POA: Diagnosis not present

## 2023-06-03 ENCOUNTER — Ambulatory Visit: Admitting: Gastroenterology

## 2023-06-10 DIAGNOSIS — Z5111 Encounter for antineoplastic chemotherapy: Secondary | ICD-10-CM | POA: Diagnosis not present

## 2023-06-10 DIAGNOSIS — Z9889 Other specified postprocedural states: Secondary | ICD-10-CM | POA: Diagnosis not present

## 2023-06-10 DIAGNOSIS — C541 Malignant neoplasm of endometrium: Secondary | ICD-10-CM | POA: Diagnosis not present

## 2023-06-10 DIAGNOSIS — L659 Nonscarring hair loss, unspecified: Secondary | ICD-10-CM | POA: Diagnosis not present

## 2023-06-12 DIAGNOSIS — E785 Hyperlipidemia, unspecified: Secondary | ICD-10-CM | POA: Diagnosis not present

## 2023-06-12 DIAGNOSIS — R7303 Prediabetes: Secondary | ICD-10-CM | POA: Diagnosis not present

## 2023-06-16 DIAGNOSIS — C541 Malignant neoplasm of endometrium: Secondary | ICD-10-CM | POA: Diagnosis not present

## 2023-06-20 DIAGNOSIS — C541 Malignant neoplasm of endometrium: Secondary | ICD-10-CM | POA: Diagnosis not present

## 2023-06-23 DIAGNOSIS — C541 Malignant neoplasm of endometrium: Secondary | ICD-10-CM | POA: Diagnosis not present

## 2023-06-23 DIAGNOSIS — Z0389 Encounter for observation for other suspected diseases and conditions ruled out: Secondary | ICD-10-CM | POA: Diagnosis not present

## 2023-06-23 DIAGNOSIS — Z01810 Encounter for preprocedural cardiovascular examination: Secondary | ICD-10-CM | POA: Diagnosis not present

## 2023-06-25 DIAGNOSIS — T451X5A Adverse effect of antineoplastic and immunosuppressive drugs, initial encounter: Secondary | ICD-10-CM | POA: Diagnosis not present

## 2023-06-25 DIAGNOSIS — Z5111 Encounter for antineoplastic chemotherapy: Secondary | ICD-10-CM | POA: Diagnosis not present

## 2023-06-25 DIAGNOSIS — R112 Nausea with vomiting, unspecified: Secondary | ICD-10-CM | POA: Diagnosis not present

## 2023-06-25 DIAGNOSIS — R110 Nausea: Secondary | ICD-10-CM | POA: Diagnosis not present

## 2023-06-25 DIAGNOSIS — R42 Dizziness and giddiness: Secondary | ICD-10-CM | POA: Diagnosis not present

## 2023-06-25 DIAGNOSIS — C541 Malignant neoplasm of endometrium: Secondary | ICD-10-CM | POA: Diagnosis not present

## 2023-07-13 DIAGNOSIS — R7303 Prediabetes: Secondary | ICD-10-CM | POA: Diagnosis not present

## 2023-07-13 DIAGNOSIS — E785 Hyperlipidemia, unspecified: Secondary | ICD-10-CM | POA: Diagnosis not present

## 2023-07-15 DIAGNOSIS — R918 Other nonspecific abnormal finding of lung field: Secondary | ICD-10-CM | POA: Diagnosis not present

## 2023-07-15 DIAGNOSIS — N939 Abnormal uterine and vaginal bleeding, unspecified: Secondary | ICD-10-CM | POA: Diagnosis not present

## 2023-07-15 DIAGNOSIS — L658 Other specified nonscarring hair loss: Secondary | ICD-10-CM | POA: Diagnosis not present

## 2023-07-15 DIAGNOSIS — C541 Malignant neoplasm of endometrium: Secondary | ICD-10-CM | POA: Diagnosis not present

## 2023-07-15 DIAGNOSIS — T451X5A Adverse effect of antineoplastic and immunosuppressive drugs, initial encounter: Secondary | ICD-10-CM | POA: Diagnosis not present

## 2023-07-15 DIAGNOSIS — N952 Postmenopausal atrophic vaginitis: Secondary | ICD-10-CM | POA: Diagnosis not present

## 2023-07-15 DIAGNOSIS — T451X5D Adverse effect of antineoplastic and immunosuppressive drugs, subsequent encounter: Secondary | ICD-10-CM | POA: Diagnosis not present

## 2023-07-15 DIAGNOSIS — Z5111 Encounter for antineoplastic chemotherapy: Secondary | ICD-10-CM | POA: Diagnosis not present

## 2023-07-15 DIAGNOSIS — K1230 Oral mucositis (ulcerative), unspecified: Secondary | ICD-10-CM | POA: Diagnosis not present

## 2023-07-22 DIAGNOSIS — E785 Hyperlipidemia, unspecified: Secondary | ICD-10-CM | POA: Diagnosis not present

## 2023-07-22 DIAGNOSIS — R7303 Prediabetes: Secondary | ICD-10-CM | POA: Diagnosis not present

## 2023-07-29 DIAGNOSIS — E785 Hyperlipidemia, unspecified: Secondary | ICD-10-CM | POA: Diagnosis not present

## 2023-07-29 DIAGNOSIS — Z1331 Encounter for screening for depression: Secondary | ICD-10-CM | POA: Diagnosis not present

## 2023-07-29 DIAGNOSIS — Z139 Encounter for screening, unspecified: Secondary | ICD-10-CM | POA: Diagnosis not present

## 2023-07-29 DIAGNOSIS — Z6830 Body mass index (BMI) 30.0-30.9, adult: Secondary | ICD-10-CM | POA: Diagnosis not present

## 2023-07-29 DIAGNOSIS — R7303 Prediabetes: Secondary | ICD-10-CM | POA: Diagnosis not present

## 2023-07-29 DIAGNOSIS — Z0000 Encounter for general adult medical examination without abnormal findings: Secondary | ICD-10-CM | POA: Diagnosis not present

## 2023-07-29 DIAGNOSIS — C541 Malignant neoplasm of endometrium: Secondary | ICD-10-CM | POA: Diagnosis not present

## 2023-07-29 DIAGNOSIS — Z1389 Encounter for screening for other disorder: Secondary | ICD-10-CM | POA: Diagnosis not present

## 2023-07-29 DIAGNOSIS — Z1339 Encounter for screening examination for other mental health and behavioral disorders: Secondary | ICD-10-CM | POA: Diagnosis not present

## 2023-08-05 DIAGNOSIS — N952 Postmenopausal atrophic vaginitis: Secondary | ICD-10-CM | POA: Diagnosis not present

## 2023-08-05 DIAGNOSIS — R918 Other nonspecific abnormal finding of lung field: Secondary | ICD-10-CM | POA: Diagnosis not present

## 2023-08-05 DIAGNOSIS — Z5111 Encounter for antineoplastic chemotherapy: Secondary | ICD-10-CM | POA: Diagnosis not present

## 2023-08-05 DIAGNOSIS — C541 Malignant neoplasm of endometrium: Secondary | ICD-10-CM | POA: Diagnosis not present

## 2023-08-05 DIAGNOSIS — N939 Abnormal uterine and vaginal bleeding, unspecified: Secondary | ICD-10-CM | POA: Diagnosis not present

## 2023-08-05 DIAGNOSIS — L658 Other specified nonscarring hair loss: Secondary | ICD-10-CM | POA: Diagnosis not present

## 2023-08-05 DIAGNOSIS — T451X5A Adverse effect of antineoplastic and immunosuppressive drugs, initial encounter: Secondary | ICD-10-CM | POA: Diagnosis not present

## 2023-08-22 DIAGNOSIS — Z9049 Acquired absence of other specified parts of digestive tract: Secondary | ICD-10-CM | POA: Diagnosis not present

## 2023-08-22 DIAGNOSIS — Z90710 Acquired absence of both cervix and uterus: Secondary | ICD-10-CM | POA: Diagnosis not present

## 2023-08-22 DIAGNOSIS — R918 Other nonspecific abnormal finding of lung field: Secondary | ICD-10-CM | POA: Diagnosis not present

## 2023-08-22 DIAGNOSIS — C541 Malignant neoplasm of endometrium: Secondary | ICD-10-CM | POA: Diagnosis not present

## 2023-08-22 DIAGNOSIS — Z90722 Acquired absence of ovaries, bilateral: Secondary | ICD-10-CM | POA: Diagnosis not present

## 2023-08-26 DIAGNOSIS — C541 Malignant neoplasm of endometrium: Secondary | ICD-10-CM | POA: Diagnosis not present

## 2023-08-26 DIAGNOSIS — R739 Hyperglycemia, unspecified: Secondary | ICD-10-CM | POA: Diagnosis not present

## 2023-08-26 DIAGNOSIS — I2510 Atherosclerotic heart disease of native coronary artery without angina pectoris: Secondary | ICD-10-CM | POA: Diagnosis not present

## 2023-08-26 DIAGNOSIS — Z5111 Encounter for antineoplastic chemotherapy: Secondary | ICD-10-CM | POA: Diagnosis not present

## 2023-08-26 DIAGNOSIS — E785 Hyperlipidemia, unspecified: Secondary | ICD-10-CM | POA: Diagnosis not present

## 2023-08-26 DIAGNOSIS — Z8249 Family history of ischemic heart disease and other diseases of the circulatory system: Secondary | ICD-10-CM | POA: Diagnosis not present

## 2023-08-26 DIAGNOSIS — Z91041 Radiographic dye allergy status: Secondary | ICD-10-CM | POA: Diagnosis not present

## 2023-09-15 DIAGNOSIS — I2510 Atherosclerotic heart disease of native coronary artery without angina pectoris: Secondary | ICD-10-CM | POA: Diagnosis not present

## 2023-09-15 DIAGNOSIS — E785 Hyperlipidemia, unspecified: Secondary | ICD-10-CM | POA: Diagnosis not present

## 2023-09-15 DIAGNOSIS — R9431 Abnormal electrocardiogram [ECG] [EKG]: Secondary | ICD-10-CM | POA: Diagnosis not present

## 2023-09-15 DIAGNOSIS — Z8249 Family history of ischemic heart disease and other diseases of the circulatory system: Secondary | ICD-10-CM | POA: Diagnosis not present

## 2023-09-16 DIAGNOSIS — Z79899 Other long term (current) drug therapy: Secondary | ICD-10-CM | POA: Diagnosis not present

## 2023-09-16 DIAGNOSIS — K30 Functional dyspepsia: Secondary | ICD-10-CM | POA: Diagnosis not present

## 2023-09-16 DIAGNOSIS — T451X5A Adverse effect of antineoplastic and immunosuppressive drugs, initial encounter: Secondary | ICD-10-CM | POA: Diagnosis not present

## 2023-09-16 DIAGNOSIS — Z5111 Encounter for antineoplastic chemotherapy: Secondary | ICD-10-CM | POA: Diagnosis not present

## 2023-09-16 DIAGNOSIS — G620 Drug-induced polyneuropathy: Secondary | ICD-10-CM | POA: Diagnosis not present

## 2023-09-16 DIAGNOSIS — Z91041 Radiographic dye allergy status: Secondary | ICD-10-CM | POA: Diagnosis not present

## 2023-09-16 DIAGNOSIS — C541 Malignant neoplasm of endometrium: Secondary | ICD-10-CM | POA: Diagnosis not present

## 2023-09-16 DIAGNOSIS — R918 Other nonspecific abnormal finding of lung field: Secondary | ICD-10-CM | POA: Diagnosis not present

## 2023-09-16 DIAGNOSIS — R739 Hyperglycemia, unspecified: Secondary | ICD-10-CM | POA: Diagnosis not present

## 2023-09-16 DIAGNOSIS — N898 Other specified noninflammatory disorders of vagina: Secondary | ICD-10-CM | POA: Diagnosis not present

## 2023-09-16 DIAGNOSIS — Z5181 Encounter for therapeutic drug level monitoring: Secondary | ICD-10-CM | POA: Diagnosis not present

## 2023-09-16 DIAGNOSIS — I2510 Atherosclerotic heart disease of native coronary artery without angina pectoris: Secondary | ICD-10-CM | POA: Diagnosis not present

## 2023-09-30 DIAGNOSIS — C541 Malignant neoplasm of endometrium: Secondary | ICD-10-CM | POA: Diagnosis not present

## 2023-09-30 DIAGNOSIS — Z79899 Other long term (current) drug therapy: Secondary | ICD-10-CM | POA: Diagnosis not present

## 2023-09-30 DIAGNOSIS — Z5181 Encounter for therapeutic drug level monitoring: Secondary | ICD-10-CM | POA: Diagnosis not present

## 2023-10-07 DIAGNOSIS — R918 Other nonspecific abnormal finding of lung field: Secondary | ICD-10-CM | POA: Diagnosis not present

## 2023-10-07 DIAGNOSIS — Z5111 Encounter for antineoplastic chemotherapy: Secondary | ICD-10-CM | POA: Diagnosis not present

## 2023-10-07 DIAGNOSIS — Z79899 Other long term (current) drug therapy: Secondary | ICD-10-CM | POA: Diagnosis not present

## 2023-10-07 DIAGNOSIS — K30 Functional dyspepsia: Secondary | ICD-10-CM | POA: Diagnosis not present

## 2023-10-07 DIAGNOSIS — Z91041 Radiographic dye allergy status: Secondary | ICD-10-CM | POA: Diagnosis not present

## 2023-10-07 DIAGNOSIS — N898 Other specified noninflammatory disorders of vagina: Secondary | ICD-10-CM | POA: Diagnosis not present

## 2023-10-07 DIAGNOSIS — Z5181 Encounter for therapeutic drug level monitoring: Secondary | ICD-10-CM | POA: Diagnosis not present

## 2023-10-07 DIAGNOSIS — C541 Malignant neoplasm of endometrium: Secondary | ICD-10-CM | POA: Diagnosis not present

## 2023-11-11 DIAGNOSIS — Z8542 Personal history of malignant neoplasm of other parts of uterus: Secondary | ICD-10-CM | POA: Diagnosis not present

## 2023-11-11 DIAGNOSIS — Z90710 Acquired absence of both cervix and uterus: Secondary | ICD-10-CM | POA: Diagnosis not present

## 2023-11-11 DIAGNOSIS — R918 Other nonspecific abnormal finding of lung field: Secondary | ICD-10-CM | POA: Diagnosis not present

## 2023-11-11 DIAGNOSIS — M51369 Other intervertebral disc degeneration, lumbar region without mention of lumbar back pain or lower extremity pain: Secondary | ICD-10-CM | POA: Diagnosis not present

## 2023-11-11 DIAGNOSIS — C541 Malignant neoplasm of endometrium: Secondary | ICD-10-CM | POA: Diagnosis not present

## 2023-11-11 DIAGNOSIS — M4316 Spondylolisthesis, lumbar region: Secondary | ICD-10-CM | POA: Diagnosis not present

## 2023-11-11 DIAGNOSIS — M47896 Other spondylosis, lumbar region: Secondary | ICD-10-CM | POA: Diagnosis not present

## 2023-11-11 DIAGNOSIS — K5730 Diverticulosis of large intestine without perforation or abscess without bleeding: Secondary | ICD-10-CM | POA: Diagnosis not present

## 2023-11-18 DIAGNOSIS — Z5111 Encounter for antineoplastic chemotherapy: Secondary | ICD-10-CM | POA: Diagnosis not present

## 2023-11-18 DIAGNOSIS — C541 Malignant neoplasm of endometrium: Secondary | ICD-10-CM | POA: Diagnosis not present

## 2023-11-24 DIAGNOSIS — R7303 Prediabetes: Secondary | ICD-10-CM | POA: Diagnosis not present

## 2023-12-01 DIAGNOSIS — Z6833 Body mass index (BMI) 33.0-33.9, adult: Secondary | ICD-10-CM | POA: Diagnosis not present

## 2023-12-01 DIAGNOSIS — C7960 Secondary malignant neoplasm of unspecified ovary: Secondary | ICD-10-CM | POA: Diagnosis not present

## 2023-12-01 DIAGNOSIS — R7303 Prediabetes: Secondary | ICD-10-CM | POA: Diagnosis not present

## 2023-12-01 DIAGNOSIS — C541 Malignant neoplasm of endometrium: Secondary | ICD-10-CM | POA: Diagnosis not present

## 2023-12-01 DIAGNOSIS — E785 Hyperlipidemia, unspecified: Secondary | ICD-10-CM | POA: Diagnosis not present

## 2023-12-01 DIAGNOSIS — Z1331 Encounter for screening for depression: Secondary | ICD-10-CM | POA: Diagnosis not present

## 2023-12-01 DIAGNOSIS — Z23 Encounter for immunization: Secondary | ICD-10-CM | POA: Diagnosis not present

## 2023-12-02 DIAGNOSIS — M5416 Radiculopathy, lumbar region: Secondary | ICD-10-CM | POA: Diagnosis not present

## 2023-12-02 DIAGNOSIS — M19011 Primary osteoarthritis, right shoulder: Secondary | ICD-10-CM | POA: Diagnosis not present

## 2023-12-02 DIAGNOSIS — M25511 Pain in right shoulder: Secondary | ICD-10-CM | POA: Diagnosis not present

## 2023-12-02 DIAGNOSIS — M545 Low back pain, unspecified: Secondary | ICD-10-CM | POA: Diagnosis not present

## 2023-12-09 DIAGNOSIS — C541 Malignant neoplasm of endometrium: Secondary | ICD-10-CM | POA: Diagnosis not present

## 2023-12-09 DIAGNOSIS — Z5111 Encounter for antineoplastic chemotherapy: Secondary | ICD-10-CM | POA: Diagnosis not present

## 2023-12-13 DIAGNOSIS — E785 Hyperlipidemia, unspecified: Secondary | ICD-10-CM | POA: Diagnosis not present

## 2023-12-13 DIAGNOSIS — R7303 Prediabetes: Secondary | ICD-10-CM | POA: Diagnosis not present

## 2023-12-16 DIAGNOSIS — M5442 Lumbago with sciatica, left side: Secondary | ICD-10-CM | POA: Diagnosis not present

## 2023-12-16 DIAGNOSIS — M160 Bilateral primary osteoarthritis of hip: Secondary | ICD-10-CM | POA: Diagnosis not present

## 2023-12-16 DIAGNOSIS — M5416 Radiculopathy, lumbar region: Secondary | ICD-10-CM | POA: Diagnosis not present

## 2023-12-16 DIAGNOSIS — G8929 Other chronic pain: Secondary | ICD-10-CM | POA: Diagnosis not present

## 2023-12-16 DIAGNOSIS — M5441 Lumbago with sciatica, right side: Secondary | ICD-10-CM | POA: Diagnosis not present

## 2023-12-17 ENCOUNTER — Other Ambulatory Visit: Payer: Self-pay | Admitting: Physical Medicine & Rehabilitation

## 2023-12-17 DIAGNOSIS — G8929 Other chronic pain: Secondary | ICD-10-CM

## 2023-12-17 DIAGNOSIS — I5020 Unspecified systolic (congestive) heart failure: Secondary | ICD-10-CM | POA: Diagnosis not present

## 2023-12-30 DIAGNOSIS — R931 Abnormal findings on diagnostic imaging of heart and coronary circulation: Secondary | ICD-10-CM | POA: Diagnosis not present

## 2023-12-30 DIAGNOSIS — C541 Malignant neoplasm of endometrium: Secondary | ICD-10-CM | POA: Diagnosis not present

## 2023-12-31 ENCOUNTER — Ambulatory Visit
Admission: RE | Admit: 2023-12-31 | Discharge: 2023-12-31 | Disposition: A | Discharge status: Home / Self Care | Source: Ambulatory Visit | Attending: Physical Medicine & Rehabilitation | Admitting: Physical Medicine & Rehabilitation

## 2023-12-31 DIAGNOSIS — G8929 Other chronic pain: Secondary | ICD-10-CM

## 2023-12-31 DIAGNOSIS — M48061 Spinal stenosis, lumbar region without neurogenic claudication: Secondary | ICD-10-CM | POA: Diagnosis not present

## 2023-12-31 DIAGNOSIS — M4316 Spondylolisthesis, lumbar region: Secondary | ICD-10-CM | POA: Diagnosis not present

## 2023-12-31 DIAGNOSIS — M47816 Spondylosis without myelopathy or radiculopathy, lumbar region: Secondary | ICD-10-CM | POA: Diagnosis not present

## 2023-12-31 MED ORDER — GADOPICLENOL 0.5 MMOL/ML IV SOLN
10.0000 mL | Freq: Once | INTRAVENOUS | Status: AC | PRN
  Administered 2023-12-31: 10 mL via INTRAVENOUS

## 2024-01-12 DIAGNOSIS — R7303 Prediabetes: Secondary | ICD-10-CM | POA: Diagnosis not present

## 2024-01-12 DIAGNOSIS — E785 Hyperlipidemia, unspecified: Secondary | ICD-10-CM | POA: Diagnosis not present

## 2024-01-13 DIAGNOSIS — C541 Malignant neoplasm of endometrium: Secondary | ICD-10-CM | POA: Diagnosis not present

## 2024-01-14 DIAGNOSIS — M5441 Lumbago with sciatica, right side: Secondary | ICD-10-CM | POA: Diagnosis not present

## 2024-01-14 DIAGNOSIS — M1612 Unilateral primary osteoarthritis, left hip: Secondary | ICD-10-CM | POA: Diagnosis not present

## 2024-01-14 DIAGNOSIS — M5442 Lumbago with sciatica, left side: Secondary | ICD-10-CM | POA: Diagnosis not present

## 2024-01-14 DIAGNOSIS — M5416 Radiculopathy, lumbar region: Secondary | ICD-10-CM | POA: Diagnosis not present

## 2024-01-14 DIAGNOSIS — G8929 Other chronic pain: Secondary | ICD-10-CM | POA: Diagnosis not present

## 2024-01-20 DIAGNOSIS — C541 Malignant neoplasm of endometrium: Secondary | ICD-10-CM | POA: Diagnosis not present

## 2024-01-20 DIAGNOSIS — R931 Abnormal findings on diagnostic imaging of heart and coronary circulation: Secondary | ICD-10-CM | POA: Diagnosis not present
